# Patient Record
Sex: Male | Born: 1995 | Race: Black or African American | Hispanic: No | Marital: Single | Smoking: Current every day smoker
Health system: Southern US, Community
[De-identification: ages and names within clinical notes are randomized; demographics above are authoritative.]

## PROBLEM LIST (undated history)

## (undated) DIAGNOSIS — F419 Anxiety disorder, unspecified: Secondary | ICD-10-CM

## (undated) DIAGNOSIS — S6991XA Unspecified injury of right wrist, hand and finger(s), initial encounter: Secondary | ICD-10-CM

## (undated) DIAGNOSIS — F32A Depression, unspecified: Secondary | ICD-10-CM

## (undated) DIAGNOSIS — F329 Major depressive disorder, single episode, unspecified: Secondary | ICD-10-CM

## (undated) DIAGNOSIS — F845 Asperger's syndrome: Secondary | ICD-10-CM

## (undated) DIAGNOSIS — J45909 Unspecified asthma, uncomplicated: Secondary | ICD-10-CM

---
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
## 2014-09-08 ENCOUNTER — Encounter (HOSPITAL_COMMUNITY): Payer: Self-pay | Admitting: Emergency Medicine

## 2014-09-08 ENCOUNTER — Emergency Department (HOSPITAL_COMMUNITY)
Admission: EM | Admit: 2014-09-08 | Discharge: 2014-09-08 | Disposition: A | Discharge status: Home / Self Care | Payer: Medicaid Other | Attending: Emergency Medicine | Admitting: Emergency Medicine

## 2014-09-08 DIAGNOSIS — Z79899 Other long term (current) drug therapy: Secondary | ICD-10-CM | POA: Insufficient documentation

## 2014-09-08 DIAGNOSIS — F411 Generalized anxiety disorder: Secondary | ICD-10-CM | POA: Diagnosis not present

## 2014-09-08 DIAGNOSIS — F329 Major depressive disorder, single episode, unspecified: Secondary | ICD-10-CM | POA: Diagnosis not present

## 2014-09-08 DIAGNOSIS — Z760 Encounter for issue of repeat prescription: Secondary | ICD-10-CM | POA: Diagnosis not present

## 2014-09-08 DIAGNOSIS — F3289 Other specified depressive episodes: Secondary | ICD-10-CM | POA: Insufficient documentation

## 2014-09-08 DIAGNOSIS — F419 Anxiety disorder, unspecified: Secondary | ICD-10-CM

## 2014-09-08 DIAGNOSIS — Z8781 Personal history of (healed) traumatic fracture: Secondary | ICD-10-CM | POA: Insufficient documentation

## 2014-09-08 HISTORY — DX: Depression, unspecified: F32.A

## 2014-09-08 HISTORY — DX: Anxiety disorder, unspecified: F41.9

## 2014-09-08 HISTORY — DX: Major depressive disorder, single episode, unspecified: F32.9

## 2014-09-08 HISTORY — DX: Asperger's syndrome: F84.5

## 2014-09-08 MED ORDER — ALPRAZOLAM 0.25 MG PO TABS: 0.2500 mg | ORAL_TABLET | Freq: Three times a day (TID) | ORAL | Status: DC | PRN

## 2014-09-08 MED ORDER — ARIPIPRAZOLE 5 MG PO TABS: 5.0000 mg | ORAL_TABLET | Freq: Every day | ORAL | Status: DC

## 2014-09-08 NOTE — ED Notes (Signed)
Patient states he has been off of Abilify and xanax for the last 2-3 months.  States he stopped taking it when he lived with his parents.  States it was easier and he didn't need it.  Now having racing thoughts and feels like he needs to be back on medications.  Patient denies any SI/HI.

## 2014-09-08 NOTE — Discharge Instructions (Signed)
Be sure to follow up with your primary care provider as well as use the resource guides provided to help in continued treatment of your anxiety and depression.

## 2014-09-08 NOTE — Progress Notes (Signed)
  CARE MANAGEMENT ED NOTE 09/08/2014  Patient:  Jeffery Graham   Account Number:  [id number]  Date Initiated:  09/08/2014  Documentation initiated by:  Radford Pax  Subjective/Objective Assessment:   Patient presents to [facility] ED seeking medication refill.     Subjective/Objective Assessment Detail:   Patient with pmhx of Depression, anxiety, asperger's syndrome     Action/Plan:   Action/Plan Detail:   Anticipated DC Date:       Status Recommendation to Physician:   Result of Recommendation:    Other ED Services  Consult Working Plan    DC Planning Services  CM consult  Other  PCP issues    Choice offered to / List presented to:            Status of service:  Completed, signed off  ED Comments:   ED Comments Detail:  [facility] received phone call from [facility] regarding pcp issues for patient.  Chart review reveals patient has Medicaid [employer] with [employer] listed as his pcp.  EDCM shared this information with EDRN. [facility] faxed over to [facility] at [phone] printed information of pcps who acccept Medicaid, financial resources in the community, phone number to contact DSS,  discounted pharmacies as requested.  [facility] also faxed over [facility] pamphlet.  EDCM informed EDRN if patient wanted to change his pcp he would have to contact the DSS.  No further EDCM needs at this time.

## 2014-09-08 NOTE — ED Provider Notes (Signed)
CSN: [id number]     Arrival date & time 09/08/14  1417 History  This chart was scribed for a non-physician practitioner, Junius Finner, PA-C working with Raeford Razor, MD by [place] Peace, ED Scribe. The patient was seen in TR10C/TR10C. The patient's care was started at 5:45 PM.     Chief Complaint  Patient presents with  . Medication Refill  . Anxiety      Patient is a 18 y.o. male presenting with anxiety. The history is provided by the patient. No language interpreter was used.  Anxiety  HPI Comments: Jeffery Graham is a 18 y.o. male who presents to the Emergency Department seeking medication refill. Pt states that he is involved in a program whose purpose is to help people get their lives together and back on the right track. He states that he was previously off of his medication he had been taking to address his anxiety and depression but recently has been realizing how much he needs it. Pt reports that he just moved out on his own and things have been getting pretty "hectic" for him. He states that his head will start racing with thoughts which will cause his head to start hurting. Pt denies any homicidal ideations or suicidal ideations. He further denies history of any serious medical problems.  States he is in the process of f/u with his PCP but does not currently have an appointment scheduled, has been going to therapy in the meantime.    Past Medical History  Diagnosis Date  . Injury of right hand 05/05/2013    Caused by pt hitting wall.  Voncille Lo' syndrome   . Anxiety   . Depression    History reviewed. No pertinent past surgical history. History reviewed. No pertinent family history. History  Substance Use Topics  . Smoking status: Never Smoker   . Smokeless tobacco: Never Used  . Alcohol Use: No    Review of Systems  Constitutional: Negative for fever.  Gastrointestinal: Negative for nausea, vomiting and diarrhea.  Psychiatric/Behavioral: The patient is  nervous/anxious.   All other systems reviewed and are negative.     Allergies  Review of patient's allergies indicates no known allergies.  Home Medications   Prior to Admission medications   Medication Sig Start Date End Date Taking? Authorizing Provider  albuterol (PROVENTIL HFA;VENTOLIN HFA) 108 (90 BASE) MCG/ACT inhaler Inhale 1 puff into the lungs every 6 (six) hours as needed for wheezing or shortness of breath.   Yes Historical Provider, MD  ALPRAZolam (XANAX) 0.25 MG tablet Take 1 tablet (0.25 mg total) by mouth 3 (three) times daily as needed for anxiety. 09/08/14   Junius Finner, PA-C  ARIPiprazole (ABILIFY) 5 MG tablet Take 1 tablet (5 mg total) by mouth daily. Pt does not take regularly, and stopped altogether against medical advise about 1 month ago. 09/08/14   Junius Finner, PA-C   BP 118/75  Pulse 67  Temp(Src) 98 F (36.7 C) (Oral)  Resp 16  Ht  (1.854 m)  Wt 160 lb (72.576 kg)  BMI 21.11 kg/m2  SpO2 97% Physical Exam  Nursing note and vitals reviewed. Constitutional: He is oriented to person, place, and time. He appears well-developed and well-nourished.  HENT:  Head: Normocephalic and atraumatic.  Eyes: EOM are normal.  Neck: Normal range of motion.  Cardiovascular: Normal rate.   Pulmonary/Chest: Effort normal.  Musculoskeletal: Normal range of motion.  Neurological: He is alert and oriented to person, place, and time.  Skin: Skin is  warm and dry.  Psychiatric: His speech is normal and behavior is normal. His mood appears anxious. He expresses no homicidal and no suicidal ideation.    ED Course  Procedures (including critical care time) Labs Review Labs Reviewed - No data to display   No results found.  Imaging Review No results found.   EKG Interpretation None     Medications - No data to display  5:50 PM- Treatment plan was discussed with patient who verbalizes understanding and agrees.   MDM   Final diagnoses:  Anxiety   Medication refill    Pt with hx of anxiety and depression requesting medication, in process of f/u with his PCP but no scheduled appointment. Denies SI/HI. Pt appears anxious but otherwise well. Denies significant PMH.  Will refill short course of his ambilify and xanax. Provided resources for [facility] walk-in clinic, advised to f/u tomorrow for further evaluation and treatment of his anxiety and depression, also provided other behavioral health resource guides.  Return precautions provided. Pt verbalized understanding and agreement with tx plan.     I personally performed the services described in this documentation, which was scribed in my presence. The recorded information has been reviewed and is accurate.   Junius Finner, PA-C 09/08/14 1827

## 2014-09-08 NOTE — ED Notes (Signed)
Pt here requesting help getting back on medicine for anxiety and depression; pt sts racing thoughts recently; pt denies SI/HI

## 2014-09-09 NOTE — ED Provider Notes (Signed)
Medical screening examination/treatment/procedure(s) were performed by non-physician practitioner and as supervising physician I was immediately available for consultation/collaboration.   EKG Interpretation None       Raeford Razor, MD 09/09/14 [phone]

## 2019-02-11 ENCOUNTER — Emergency Department (HOSPITAL_BASED_OUTPATIENT_CLINIC_OR_DEPARTMENT_OTHER)
Admission: EM | Admit: 2019-02-11 | Discharge: 2019-02-11 | Disposition: A | Discharge status: Home / Self Care | Payer: Self-pay | Attending: Emergency Medicine | Admitting: Emergency Medicine

## 2019-02-11 ENCOUNTER — Other Ambulatory Visit: Payer: Self-pay

## 2019-02-11 ENCOUNTER — Encounter (HOSPITAL_BASED_OUTPATIENT_CLINIC_OR_DEPARTMENT_OTHER): Payer: Self-pay

## 2019-02-11 DIAGNOSIS — J45909 Unspecified asthma, uncomplicated: Secondary | ICD-10-CM | POA: Insufficient documentation

## 2019-02-11 DIAGNOSIS — F17210 Nicotine dependence, cigarettes, uncomplicated: Secondary | ICD-10-CM | POA: Insufficient documentation

## 2019-02-11 DIAGNOSIS — R202 Paresthesia of skin: Secondary | ICD-10-CM | POA: Insufficient documentation

## 2019-02-11 DIAGNOSIS — G510 Bell's palsy: Secondary | ICD-10-CM | POA: Insufficient documentation

## 2019-02-11 DIAGNOSIS — F1210 Cannabis abuse, uncomplicated: Secondary | ICD-10-CM | POA: Insufficient documentation

## 2019-02-11 HISTORY — DX: Unspecified asthma, uncomplicated: J45.909

## 2019-02-11 MED ORDER — PREDNISONE 10 MG PO TABS
ORAL_TABLET | ORAL | Status: AC
  Filled 2019-02-11: qty 1

## 2019-02-11 MED ORDER — PREDNISONE 50 MG PO TABS
ORAL_TABLET | ORAL | Status: AC
  Administered 2019-02-11: 60 mg
  Filled 2019-02-11: qty 1

## 2019-02-11 MED ORDER — VALACYCLOVIR HCL 500 MG PO TABS
ORAL_TABLET | ORAL | Status: AC
  Administered 2019-02-11: 1000 mg
  Filled 2019-02-11: qty 2

## 2019-02-11 MED ORDER — PREDNISONE 10 MG PO TABS: ORAL_TABLET | ORAL | 0 refills | Status: AC

## 2019-02-11 MED ORDER — VALACYCLOVIR HCL 1 G PO TABS: 1000.0000 mg | ORAL_TABLET | Freq: Two times a day (BID) | ORAL | 0 refills | Status: AC

## 2019-02-11 NOTE — ED Triage Notes (Addendum)
Pt c/o numbness and "feeling like I didn't have control" to right side of face on 2/22-states he was unable to wink right eye, change in taste-sx worse throughout the week-denies UE/LE numbness-NAD-partial paralysis noted to right side of face, right eye tearing

## 2019-02-11 NOTE — ED Provider Notes (Signed)
[facility] Provider Note: Lowella Dell, MD, FACEP  CSN: [id number] MRN: [id number] ARRIVAL: 02/11/19 at 2212 ROOM: [facility]2/[facility]2   CHIEF COMPLAINT  Numbness   HISTORY OF PRESENT ILLNESS  02/11/19 10:53 PM Jeffery Graham is a 23 y.o. male who states something "felt funny with my taste" for the past 5 days.  Yesterday he and his significant other noticed that the right side of his face was drooping.  He does not have complete paresis of the right side of the face.  He cannot fully close his right eye.  He has some paresthesias around the right side of his mouth but not the remainder of his face.  He denies any associated pain.  Nothing makes his symptoms better or worse.   Past Medical History:  Diagnosis Date  . Anxiety   . Aspergers' syndrome   . Asthma   . Depression   . Injury of right hand 05/05/2013   Caused by pt hitting wall.    History reviewed. No pertinent surgical history.  No family history on file.  Social History   Tobacco Use  . Smoking status: Current Every Day Smoker    Types: Cigarettes  . Smokeless tobacco: Never Used  Substance Use Topics  . Alcohol use: No  . Drug use: Yes    Types: Marijuana    Prior to Admission medications   Medication Sig Start Date End Date Taking? Authorizing Provider  albuterol (PROVENTIL HFA;VENTOLIN HFA) 108 (90 BASE) MCG/ACT inhaler Inhale 1 puff into the lungs every 6 (six) hours as needed for wheezing or shortness of breath.    [provider]  predniSONE (DELTASONE) 10 MG tablet Take 3 tablets daily for 5 days, then 2 tablets daily for 2 days, then 1 tablet daily for 2 days. 02/11/19   Sabrie Moritz, MD  valACYclovir (VALTREX) 1000 MG tablet Take 1 tablet (1,000 mg total) by mouth 2 (two) times daily. 02/11/19   Lazar Tierce, Jonny Ruiz, MD    Allergies Patient has no known allergies.   REVIEW OF SYSTEMS  Negative except as noted here or in the History of Present Illness.   PHYSICAL EXAMINATION  Initial  Vital Signs Blood pressure 108/87, pulse 97, temperature 97.6 F (36.4 C), temperature source Oral, resp. rate 18, height 6' (1.829 m), weight 78.3 kg, SpO2 97 %.  Examination General: Well-developed, well-nourished male in no acute distress; appearance consistent with age of record HENT: normocephalic; atraumatic Eyes: pupils equal, round and reactive to light; extraocular muscles intact Neck: supple Heart: regular rate and rhythm Lungs: clear to auscultation bilaterally Abdomen: soft; nondistended; nontender; bowel sounds present Extremities: No deformity; full range of motion Neurologic: Awake, alert and oriented; motor function intact in all extremities and symmetric; mild right facial droop with inability to close right eye tightly but lids will oppose; 6 decreased sensation around right side of mouth without complete insensitivity Skin: Warm and dry Psychiatric: Normal mood and affect   RESULTS  Summary of this visit's results, reviewed by myself:   EKG Interpretation  Date/Time:    Ventricular Rate:    PR Interval:    QRS Duration:   QT Interval:    QTC Calculation:   R Axis:     Text Interpretation:        Laboratory Studies: No results found for this or any previous visit (from the past 24 hour(s)). Imaging Studies: No results found.  ED COURSE and MDM  Nursing notes and initial vitals signs, including pulse oximetry, reviewed.  Vitals:   02/11/19 2216 02/11/19 2217  BP: 108/87   Pulse: 97   Resp: 18   Temp: 97.6 F (36.4 C)   TempSrc: Oral   SpO2: 97%   Weight:  78.3 kg  Height:  6' (1.829 m)   Examination consistent with Bell's palsy.  PROCEDURES    ED DIAGNOSES     ICD-10-CM   1. Bell's palsy G51.0        Maddux First, MD 02/11/19 2304

## 2020-05-16 DEATH — deceased
